# Patient Record
Sex: Male | Born: 1955
Health system: Southern US, Community
[De-identification: ages and names within clinical notes are randomized; demographics above are authoritative.]

## PROBLEM LIST (undated history)

## (undated) DIAGNOSIS — N200 Calculus of kidney: Secondary | ICD-10-CM

## (undated) DIAGNOSIS — E7800 Pure hypercholesterolemia, unspecified: Secondary | ICD-10-CM

## (undated) DIAGNOSIS — I2510 Atherosclerotic heart disease of native coronary artery without angina pectoris: Secondary | ICD-10-CM

## (undated) DIAGNOSIS — I219 Acute myocardial infarction, unspecified: Secondary | ICD-10-CM

## (undated) DIAGNOSIS — E119 Type 2 diabetes mellitus without complications: Secondary | ICD-10-CM

## (undated) DIAGNOSIS — E079 Disorder of thyroid, unspecified: Secondary | ICD-10-CM

## (undated) DIAGNOSIS — G8929 Other chronic pain: Secondary | ICD-10-CM

## (undated) DIAGNOSIS — I10 Essential (primary) hypertension: Secondary | ICD-10-CM

## (undated) HISTORY — PX: CORONARY ANGIOPLASTY WITH STENT PLACEMENT: SHX49

## (undated) HISTORY — PX: CHOLECYSTECTOMY: SHX55

## (undated) HISTORY — PX: BACK SURGERY: SHX140

---

## 2017-06-20 ENCOUNTER — Emergency Department (HOSPITAL_BASED_OUTPATIENT_CLINIC_OR_DEPARTMENT_OTHER): Payer: BLUE CROSS/BLUE SHIELD

## 2017-06-20 ENCOUNTER — Encounter (HOSPITAL_BASED_OUTPATIENT_CLINIC_OR_DEPARTMENT_OTHER): Payer: Self-pay

## 2017-06-20 ENCOUNTER — Emergency Department (HOSPITAL_BASED_OUTPATIENT_CLINIC_OR_DEPARTMENT_OTHER)
Admission: EM | Admit: 2017-06-20 | Discharge: 2017-06-20 | Disposition: A | Discharge status: Home / Self Care | Payer: BLUE CROSS/BLUE SHIELD | Attending: Emergency Medicine | Admitting: Emergency Medicine

## 2017-06-20 DIAGNOSIS — I119 Hypertensive heart disease without heart failure: Secondary | ICD-10-CM | POA: Diagnosis not present

## 2017-06-20 DIAGNOSIS — N200 Calculus of kidney: Secondary | ICD-10-CM | POA: Diagnosis not present

## 2017-06-20 DIAGNOSIS — R1084 Generalized abdominal pain: Secondary | ICD-10-CM | POA: Diagnosis present

## 2017-06-20 DIAGNOSIS — I2510 Atherosclerotic heart disease of native coronary artery without angina pectoris: Secondary | ICD-10-CM | POA: Diagnosis not present

## 2017-06-20 DIAGNOSIS — I252 Old myocardial infarction: Secondary | ICD-10-CM | POA: Insufficient documentation

## 2017-06-20 HISTORY — DX: Atherosclerotic heart disease of native coronary artery without angina pectoris: I25.10

## 2017-06-20 HISTORY — DX: Acute myocardial infarction, unspecified: I21.9

## 2017-06-20 HISTORY — DX: Calculus of kidney: N20.0

## 2017-06-20 HISTORY — DX: Disorder of thyroid, unspecified: E07.9

## 2017-06-20 HISTORY — DX: Essential (primary) hypertension: I10

## 2017-06-20 HISTORY — DX: Pure hypercholesterolemia, unspecified: E78.00

## 2017-06-20 HISTORY — DX: Type 2 diabetes mellitus without complications: E11.9

## 2017-06-20 LAB — URINALYSIS, MICROSCOPIC (REFLEX)
Squamous Epithelial / LPF: NONE SEEN
WBC, UA: NONE SEEN WBC/hpf (ref 0–5)

## 2017-06-20 LAB — URINALYSIS, ROUTINE W REFLEX MICROSCOPIC
BILIRUBIN URINE: NEGATIVE
GLUCOSE, UA: 100 mg/dL — AB
KETONES UR: 40 mg/dL — AB
Leukocytes, UA: NEGATIVE
NITRITE: NEGATIVE
PH: 5 (ref 5.0–8.0)
Protein, ur: NEGATIVE mg/dL
Specific Gravity, Urine: 1.023 (ref 1.005–1.030)

## 2017-06-20 MED ORDER — HYDROCODONE-ACETAMINOPHEN 5-325 MG PO TABS: 1.0000 | ORAL_TABLET | ORAL | 0 refills | Status: DC | PRN

## 2017-06-20 MED ORDER — HYDROMORPHONE HCL 1 MG/ML IJ SOLN
1.0000 mg | Freq: Once | INTRAMUSCULAR | Status: AC
  Administered 2017-06-20: 1 mg via INTRAVENOUS
  Filled 2017-06-20: qty 1

## 2017-06-20 MED ORDER — ONDANSETRON HCL 4 MG/2ML IJ SOLN
4.0000 mg | Freq: Once | INTRAMUSCULAR | Status: AC
  Administered 2017-06-20: 4 mg via INTRAVENOUS
  Filled 2017-06-20: qty 2

## 2017-06-20 MED ORDER — HYDROMORPHONE HCL 1 MG/ML IJ SOLN
1.0000 mg | Freq: Once | INTRAMUSCULAR | Status: AC | PRN
  Administered 2017-06-20: 1 mg via INTRAVENOUS
  Filled 2017-06-20: qty 1

## 2017-06-20 MED ORDER — SODIUM CHLORIDE 0.9 % IV SOLN
Freq: Once | INTRAVENOUS | Status: AC
  Administered 2017-06-20: 07:00:00 via INTRAVENOUS

## 2017-06-20 MED ORDER — KETOROLAC TROMETHAMINE 15 MG/ML IJ SOLN
15.0000 mg | Freq: Once | INTRAMUSCULAR | Status: AC
  Administered 2017-06-20: 15 mg via INTRAVENOUS
  Filled 2017-06-20: qty 1

## 2017-06-20 MED ORDER — ONDANSETRON HCL 4 MG PO TABS: 4.0000 mg | ORAL_TABLET | Freq: Three times a day (TID) | ORAL | 0 refills | Status: DC | PRN

## 2017-06-20 MED ORDER — TAMSULOSIN HCL 0.4 MG PO CAPS: 0.4000 mg | ORAL_CAPSULE | Freq: Every day | ORAL | 0 refills | Status: DC

## 2017-06-20 MED FILL — TAMSULOSIN HCL 0.4 MG CAP: 0.4 | 14 days supply | Qty: 14 | Fill #0

## 2017-06-20 MED FILL — HYDROCODON-APAP 5-325: 5-325 | 2 days supply | Qty: 10 | Fill #0

## 2017-06-20 MED FILL — ONDANSETRON HCL 4 MG TABLET: 4 | 4 days supply | Qty: 12 | Fill #0

## 2017-06-20 NOTE — ED Provider Notes (Signed)
Care assumed from Dr. Read DriversMolpus.   At time of transfer of care, patient is awaiting results of urinalysis and CT scan for suspected kidney stone.  Patient reported feeling better after pain medications. Urinalysis did not show evidence of UTI or infected stone. CT study shows evidence of 2 mm stone at the left UVJ.  Patient was reassessed and had improvement in pain. Patient given prescription for Flomax, pain medicine, nausea medicine and discharged home with instructions to follow-up with a PCP and possibly urology. Patient understood return precautions for any new or worsened symptoms. Patient had no other questions or concerns and was discharged in good condition.    Clinical Impression: 1. Kidney stone on left side     Disposition: Discharge  Condition: Good  I have discussed the results, Dx and Tx plan with the pt(& family if present). He/she/they expressed understanding and agree(s) with the plan. Discharge instructions discussed at great length. Strict return precautions discussed and pt &/or family have verbalized understanding of the instructions. No further questions at time of discharge.    New Prescriptions   HYDROCODONE-ACETAMINOPHEN (NORCO/VICODIN) 5-325 MG TABLET    Take 1 tablet by mouth every 4 (four) hours as needed.   ONDANSETRON (ZOFRAN) 4 MG TABLET    Take 1 tablet (4 mg total) by mouth every 8 (eight) hours as needed for nausea or vomiting.   TAMSULOSIN (FLOMAX) 0.4 MG CAPS CAPSULE    Take 1 capsule (0.4 mg total) by mouth daily.    [facility] [address] [place] [id number] [phone] Schedule an appointment as soon as possible for a visit    [facility] [address] [id number] [place]c [address] [phone]  If symptoms worsen     Morgyn Marut, Canary Brimhristopher J, MD 06/20/17 1123

## 2017-06-20 NOTE — Discharge Instructions (Signed)
Please use the pain medicine and nausea medicine and Flomax to help with your discomfort and to help pass your stone. Please watch for signs and symptoms of infection. Please follow-up with a primary care physician for further management. If any symptoms change or worsen, please return to the nearest emergency department.

## 2017-06-20 NOTE — ED Notes (Signed)
Patient transported to CT 

## 2017-06-20 NOTE — ED Triage Notes (Signed)
Pt c/o sudden onset left flank pain that radiates to lower abdomen with n/v

## 2017-06-20 NOTE — ED Provider Notes (Signed)
[facility] Provider Note: Lowella DellJ. Lane Makell Drohan, MD, FACEP  CSN: [id number] MRN: [id number] ARRIVAL: 06/20/17 at 0622 ROOM: MH05/[facility]05   CHIEF COMPLAINT  Flank Pain   HISTORY OF PRESENT ILLNESS  Adrian Carlson is a 61 y.o. male with a history of kidney stones. He is here with left flank pain that began about 1:30 this morning. He took an oxycodone tablet with some improvement was able to go back to sleep. He was reawakened about 4:30 AM with severe pain. He has vomited multiple times and has urinated several times. He also feels that urge to move his bowels but has only passed a small amount of stool. The pain in his flank radiates to his left lower quadrant. Pain is similar to previous kidney stones. There is no significant change with movement or palpation.    Past Medical History:  Diagnosis Date  . Coronary artery disease   . Diabetes mellitus without complication (HCC)   . High cholesterol   . Hypertension   . Kidney stone   . MI (myocardial infarction) (HCC)    twice, 2015  . Thyroid disease     Past Surgical History:  Procedure Laterality Date  . BACK SURGERY    . CHOLECYSTECTOMY    . CORONARY ANGIOPLASTY WITH STENT PLACEMENT      No family history on file.  Social History  Substance Use Topics  . Smoking status: Never Smoker  . Smokeless tobacco: Never Used  . Alcohol use No    Prior to Admission medications   Medication Sig Start Date End Date Taking? Authorizing Provider  HYDROcodone-acetaminophen (NORCO/VICODIN) 5-325 MG tablet Take 1 tablet by mouth every 4 (four) hours as needed. 06/20/17   Tegeler, Canary Brimhristopher J, MD  ondansetron (ZOFRAN) 4 MG tablet Take 1 tablet (4 mg total) by mouth every 8 (eight) hours as needed for nausea or vomiting. 06/20/17   Tegeler, Canary Brimhristopher J, MD  tamsulosin (FLOMAX) 0.4 MG CAPS capsule Take 1 capsule (0.4 mg total) by mouth daily. 06/20/17   Tegeler, Canary Brimhristopher J, MD    Allergies Patient has no known  allergies.   REVIEW OF SYSTEMS  Negative except as noted here or in the History of Present Illness.   PHYSICAL EXAMINATION  Initial Vital Signs Blood pressure (!) 157/93, pulse 66, temperature 98.2 F (36.8 C), temperature source Oral, resp. rate 18, height 6\' 1"  (1.854 m), weight 113.4 kg (250 lb), SpO2 100 %.  Examination General: Well-developed, well-nourished male in no acute distress; appearance consistent with age of record HENT: normocephalic; atraumatic Eyes: pupils equal, round and reactive to light; extraocular muscles intact Neck: supple Heart: regular rate and rhythm Lungs: clear to auscultation bilaterally Abdomen: soft; nondistended; minimal left lower quadrant tenderness; no masses or hepatosplenomegaly; bowel sounds present GU: No CVA tenderness Extremities: No deformity; full range of motion; pulses normal Neurologic: Awake, alert and oriented; motor function intact in all extremities and symmetric; no facial droop Skin: Warm and dry Psychiatric: Grimacing   RESULTS  Summary of this visit's results, reviewed by myself:   EKG Interpretation  Date/Time:    Ventricular Rate:    PR Interval:    QRS Duration:   QT Interval:    QTC Calculation:   R Axis:     Text Interpretation:        Laboratory Studies: Results for orders placed or performed during the hospital encounter of 06/20/17 (from the past 24 hour(s))  Urinalysis, Routine w reflex microscopic     Status: Abnormal  Collection Time: 06/20/17  6:53 AM  Result Value Ref Range   Color, Urine YELLOW YELLOW   APPearance CLOUDY (A) CLEAR   Specific Gravity, Urine 1.023 1.005 - 1.030   pH 5.0 5.0 - 8.0   Glucose, UA 100 (A) NEGATIVE mg/dL   Hgb urine dipstick LARGE (A) NEGATIVE   Bilirubin Urine NEGATIVE NEGATIVE   Ketones, ur 40 (A) NEGATIVE mg/dL   Protein, ur NEGATIVE NEGATIVE mg/dL   Nitrite NEGATIVE NEGATIVE   Leukocytes, UA NEGATIVE NEGATIVE  Urinalysis, Microscopic (reflex)     Status:  Abnormal   Collection Time: 06/20/17  6:53 AM  Result Value Ref Range   RBC / HPF TOO NUMEROUS TO COUNT 0 - 5 RBC/hpf   WBC, UA NONE SEEN 0 - 5 WBC/hpf   Bacteria, UA FEW (A) NONE SEEN   Squamous Epithelial / LPF NONE SEEN NONE SEEN   Uric Acid Crys, UA PRESENT    Imaging Studies: Ct Renal Stone Study  Result Date: 06/20/2017 CLINICAL DATA:  Acute onset left flank pain this morning. Nausea. Nephrolithiasis. EXAM: CT ABDOMEN AND PELVIS WITHOUT CONTRAST TECHNIQUE: Multidetector CT imaging of the abdomen and pelvis was performed following the standard protocol without IV contrast. COMPARISON:  None. FINDINGS: Lower chest: No acute findings. Hepatobiliary: No masses visualized on this unenhanced exam. Mild hepatic steatosis. Prior cholecystectomy noted. No evidence of biliary dilatation. Pancreas: No mass or inflammatory process visualized on this unenhanced exam. Spleen:  Within normal limits in size. Adrenals/Urinary tract: Several tiny less than 5 mm intrarenal calculi are seen bilaterally. Mild left hydronephrosis is seen due to a 2 mm calculus in the distal left ureter near the ureterovesical junction. Unremarkable urinary bladder. Stomach/Bowel: No evidence of obstruction, inflammatory process, or abnormal fluid collections. Sigmoid diverticulosis noted, without evidence of diverticulitis. Normal appendix visualized. Vascular/Lymphatic: No pathologically enlarged lymph nodes identified. No evidence of abdominal aortic aneurysm. Aortic atherosclerosis. Reproductive:  No mass or other significant abnormality. Other:  Small left inguinal hernia containing only fat. Musculoskeletal:  No suspicious bone lesions identified. IMPRESSION: Mild left hydroureteronephrosis due to 2 mm distal ureteral calculus near the left ureterovesical junction. Colonic diverticulosis. No radiographic evidence of diverticulitis. Mild hepatic steatosis. Aortic atherosclerosis. Electronically Signed   By: Myles Rosenthal M.D.   On:  06/20/2017 07:55    ED COURSE  Nursing notes and initial vitals signs, including pulse oximetry, reviewed.  Vitals:   06/20/17 0633 06/20/17 0736 06/20/17 0843  BP: (!) 157/93 (!) 154/84 (!) 151/95  Pulse: 66 (!) 56 64  Resp: 18 20 18   Temp: 98.2 F (36.8 C)    TempSrc: Oral    SpO2: 100% 94% 99%  Weight: 113.4 kg (250 lb)    Height: 6\' 1"  (1.854 m)     6:58 AM Pain improved with IV Dilaudid. Urinalysis and CT scan pending. Dr. Rush Landmark to follow up on results and make disposition.  PROCEDURES    ED DIAGNOSES     ICD-10-CM   1. Kidney stone on left side N20.0        Shataya Winkles, MD 06/20/17 1046

## 2018-05-01 ENCOUNTER — Encounter (HOSPITAL_BASED_OUTPATIENT_CLINIC_OR_DEPARTMENT_OTHER): Payer: Self-pay | Admitting: *Deleted

## 2018-05-01 ENCOUNTER — Other Ambulatory Visit: Payer: Self-pay

## 2018-05-01 ENCOUNTER — Emergency Department (HOSPITAL_BASED_OUTPATIENT_CLINIC_OR_DEPARTMENT_OTHER)
Admission: EM | Admit: 2018-05-01 | Discharge: 2018-05-01 | Disposition: A | Discharge status: Home / Self Care | Payer: BLUE CROSS/BLUE SHIELD | Attending: Emergency Medicine | Admitting: Emergency Medicine

## 2018-05-01 DIAGNOSIS — I252 Old myocardial infarction: Secondary | ICD-10-CM | POA: Insufficient documentation

## 2018-05-01 DIAGNOSIS — E119 Type 2 diabetes mellitus without complications: Secondary | ICD-10-CM | POA: Insufficient documentation

## 2018-05-01 DIAGNOSIS — I10 Essential (primary) hypertension: Secondary | ICD-10-CM | POA: Insufficient documentation

## 2018-05-01 DIAGNOSIS — I2510 Atherosclerotic heart disease of native coronary artery without angina pectoris: Secondary | ICD-10-CM | POA: Insufficient documentation

## 2018-05-01 DIAGNOSIS — E079 Disorder of thyroid, unspecified: Secondary | ICD-10-CM | POA: Insufficient documentation

## 2018-05-01 DIAGNOSIS — N23 Unspecified renal colic: Secondary | ICD-10-CM | POA: Diagnosis not present

## 2018-05-01 DIAGNOSIS — R109 Unspecified abdominal pain: Secondary | ICD-10-CM | POA: Diagnosis present

## 2018-05-01 LAB — COMPREHENSIVE METABOLIC PANEL
ALT: 21 U/L (ref 17–63)
ANION GAP: 9 (ref 5–15)
AST: 21 U/L (ref 15–41)
Albumin: 4.5 g/dL (ref 3.5–5.0)
Alkaline Phosphatase: 59 U/L (ref 38–126)
BUN: 22 mg/dL — ABNORMAL HIGH (ref 6–20)
CHLORIDE: 106 mmol/L (ref 101–111)
CO2: 26 mmol/L (ref 22–32)
Calcium: 9.4 mg/dL (ref 8.9–10.3)
Creatinine, Ser: 1.37 mg/dL — ABNORMAL HIGH (ref 0.61–1.24)
GFR calc Af Amer: >60 mL/min (ref >60)
GFR, EST NON AFRICAN AMERICAN: 55 mL/min — AB (ref >60)
Glucose, Bld: 243 mg/dL — ABNORMAL HIGH (ref 65–99)
Potassium: 4.3 mmol/L (ref 3.5–5.1)
SODIUM: 141 mmol/L (ref 135–145)
Total Bilirubin: 0.7 mg/dL (ref 0.3–1.2)
Total Protein: 7.6 g/dL (ref 6.5–8.1)

## 2018-05-01 LAB — URINALYSIS, ROUTINE W REFLEX MICROSCOPIC
Bilirubin Urine: NEGATIVE
Glucose, UA: 250 mg/dL — AB
KETONES UR: NEGATIVE mg/dL
LEUKOCYTES UA: NEGATIVE
NITRITE: NEGATIVE
PH: 5 (ref 5.0–8.0)
Protein, ur: NEGATIVE mg/dL

## 2018-05-01 LAB — CBC WITH DIFFERENTIAL/PLATELET
BASOS ABS: 0 10*3/uL (ref 0.0–0.1)
Basophils Relative: 0 %
Eosinophils Absolute: 0.1 10*3/uL (ref 0.0–0.7)
Eosinophils Relative: 1 %
HCT: 46.4 % (ref 39.0–52.0)
Hemoglobin: 16.2 g/dL (ref 13.0–17.0)
LYMPHS PCT: 12 %
Lymphs Abs: 1 10*3/uL (ref 0.7–4.0)
MCH: 32.6 pg (ref 26.0–34.0)
MCHC: 34.9 g/dL (ref 30.0–36.0)
MCV: 93.4 fL (ref 78.0–100.0)
MONO ABS: 0.5 10*3/uL (ref 0.1–1.0)
Monocytes Relative: 6 %
Neutro Abs: 6.7 10*3/uL (ref 1.7–7.7)
Neutrophils Relative %: 81 %
PLATELETS: 183 10*3/uL (ref 150–400)
RBC: 4.97 MIL/uL (ref 4.22–5.81)
RDW: 12.9 % (ref 11.5–15.5)
WBC: 8.3 10*3/uL (ref 4.0–10.5)

## 2018-05-01 LAB — URINALYSIS, MICROSCOPIC (REFLEX)

## 2018-05-01 MED ORDER — OXYCODONE-ACETAMINOPHEN 5-325 MG PO TABS: 1.0000 | ORAL_TABLET | ORAL | 0 refills | Status: DC | PRN

## 2018-05-01 MED ORDER — ONDANSETRON HCL 4 MG/2ML IJ SOLN
INTRAMUSCULAR | Status: AC
  Filled 2018-05-01: qty 2

## 2018-05-01 MED ORDER — HYDROMORPHONE HCL 1 MG/ML IJ SOLN
1.0000 mg | Freq: Once | INTRAMUSCULAR | Status: AC
  Administered 2018-05-01: 1 mg via INTRAVENOUS
  Filled 2018-05-01: qty 1

## 2018-05-01 MED ORDER — ONDANSETRON HCL 4 MG/2ML IJ SOLN
4.0000 mg | Freq: Once | INTRAMUSCULAR | Status: AC
  Administered 2018-05-01: 4 mg via INTRAVENOUS

## 2018-05-01 MED ORDER — TAMSULOSIN HCL 0.4 MG PO CAPS: 0.4000 mg | ORAL_CAPSULE | Freq: Every day | ORAL | 0 refills | Status: AC

## 2018-05-01 MED ORDER — HYDROMORPHONE HCL 1 MG/ML IJ SOLN
0.5000 mg | Freq: Once | INTRAMUSCULAR | Status: AC
  Administered 2018-05-01: 0.5 mg via INTRAVENOUS
  Filled 2018-05-01: qty 1

## 2018-05-01 MED ORDER — ONDANSETRON HCL 4 MG PO TABS: 4.0000 mg | ORAL_TABLET | Freq: Three times a day (TID) | ORAL | 0 refills | Status: DC | PRN

## 2018-05-01 MED ORDER — KETOROLAC TROMETHAMINE 30 MG/ML IJ SOLN
30.0000 mg | Freq: Once | INTRAMUSCULAR | Status: AC
  Administered 2018-05-01: 30 mg via INTRAVENOUS
  Filled 2018-05-01: qty 1

## 2018-05-01 MED ORDER — SODIUM CHLORIDE 0.9 % IV BOLUS
1000.0000 mL | Freq: Once | INTRAVENOUS | Status: AC
  Administered 2018-05-01: 1000 mL via INTRAVENOUS

## 2018-05-01 MED FILL — TAMSULOSIN HCL 0.4 MG CAP: 0.4 | 10 days supply | Qty: 10 | Fill #0

## 2018-05-01 MED FILL — ONDANSETRON HCL 4 MG TABLET: 4 | 4 days supply | Qty: 12 | Fill #0

## 2018-05-01 MED FILL — OXYCODONE-ACETAMINOPHEN 5-3: 5-325 | 2 days supply | Qty: 10 | Fill #0

## 2018-05-01 NOTE — ED Provider Notes (Signed)
[facility] Provider Note   CSN: [id number] Arrival date & time: 05/01/18  1914     History   Chief Complaint Chief Complaint  Patient presents with  . Flank Pain    HPI Adrian Carlson is a 62 y.o. male.  HPI Patient with a history of kidney stones presents with acute onset right flank pain radiating to the right abdomen starting at 5 AM this morning.  Associated with nausea and vomiting.  No hematuria, dysuria or urinary hesitancy.  States pain is the same as when he is past previous kidney stones. Past Medical History:  Diagnosis Date  . Coronary artery disease   . Diabetes mellitus without complication (HCC)   . High cholesterol   . Hypertension   . Kidney stone   . MI (myocardial infarction) (HCC)    twice, 2015  . Thyroid disease     There are no active problems to display for this patient.   Past Surgical History:  Procedure Laterality Date  . BACK SURGERY    . CHOLECYSTECTOMY    . CORONARY ANGIOPLASTY WITH STENT PLACEMENT          Home Medications    Prior to Admission medications   Medication Sig Start Date End Date Taking? Authorizing Provider  HYDROcodone-acetaminophen (NORCO/VICODIN) 5-325 MG tablet Take 1 tablet by mouth every 4 (four) hours as needed. 06/20/17   Tegeler, Canary Brim, MD  ondansetron (ZOFRAN) 4 MG tablet Take 1 tablet (4 mg total) by mouth every 8 (eight) hours as needed for nausea or vomiting. 05/01/18   Loren Racer, MD  oxyCODONE-acetaminophen (PERCOCET) 5-325 MG tablet Take 1 tablet by mouth every 4 (four) hours as needed for severe pain. 05/01/18   Loren Racer, MD  tamsulosin (FLOMAX) 0.4 MG CAPS capsule Take 1 capsule (0.4 mg total) by mouth daily. 05/01/18   Loren Racer, MD    Family History History reviewed. No pertinent family history.  Social History Social History   Tobacco Use  . Smoking status: Never Smoker  . Smokeless tobacco: Never Used  Substance Use Topics  . Alcohol  use: No  . Drug use: Not on file     Allergies   Patient has no known allergies.   Review of Systems Review of Systems  Constitutional: Negative for chills and fever.  Respiratory: Negative for shortness of breath.   Cardiovascular: Negative for chest pain.  Gastrointestinal: Positive for abdominal pain, nausea and vomiting. Negative for constipation and diarrhea.  Genitourinary: Positive for flank pain. Negative for dysuria, frequency, hematuria, scrotal swelling and testicular pain.  Musculoskeletal: Positive for back pain. Negative for neck pain.  Skin: Negative for rash and wound.  Neurological: Negative for weakness, numbness and headaches.  All other systems reviewed and are negative.    Physical Exam Updated Vital Signs BP 127/81   Pulse (!) 49   Temp 98 F (36.7 C) (Oral)   Resp (!) 28   Ht  (1.854 m)   Wt 120.2 kg (265 lb)   SpO2 97%   BMI 34.96 kg/m   Physical Exam  Constitutional: He is oriented to person, place, and time. He appears well-developed and well-nourished. He appears distressed.  Patient appears uncomfortable.  HENT:  Head: Normocephalic and atraumatic.  Mouth/Throat: Oropharynx is clear and moist.  Eyes: Pupils are equal, round, and reactive to light. EOM are normal.  Neck: Normal range of motion. Neck supple.  Cardiovascular: Normal rate and regular rhythm.  Pulmonary/Chest: Effort normal and  breath sounds normal. No respiratory distress. He has no wheezes. He has no rales.  Abdominal: Soft. Bowel sounds are normal. There is tenderness.  Very mild right lower quadrant abdominal tenderness with palpation without rebound or guarding.  Musculoskeletal: Normal range of motion. He exhibits no edema or tenderness.  Mild right CVA tenderness to percussion.  No midline thoracic or lumbar tenderness.  No lower extremity swelling, asymmetry or tenderness.  Distal pulses intact.  Neurological: He is alert and oriented to person, place, and time.    Moving all extremities without focal deficit.  Sensation intact.  Skin: Skin is warm. Capillary refill takes less than 2 seconds. No rash noted. He is diaphoretic. No erythema.  Psychiatric: He has a normal mood and affect. His behavior is normal.  Nursing note and vitals reviewed.    ED Treatments / Results  Labs (all labs ordered are listed, but only abnormal results are displayed) Labs Reviewed  COMPREHENSIVE METABOLIC PANEL - Abnormal; Notable for the following components:      Result Value   Glucose, Bld 243 (*)    BUN 22 (*)    Creatinine, Ser 1.37 (*)    GFR calc non Af Amer 55 (*)    All other components within normal limits  URINALYSIS, ROUTINE W REFLEX MICROSCOPIC - Abnormal; Notable for the following components:   Specific Gravity, Urine >1.030 (*)    Glucose, UA 250 (*)    Hgb urine dipstick MODERATE (*)    All other components within normal limits  URINALYSIS, MICROSCOPIC (REFLEX) - Abnormal; Notable for the following components:   Bacteria, UA RARE (*)    All other components within normal limits  CBC WITH DIFFERENTIAL/PLATELET    EKG None  Radiology No results found.  Procedures Procedures (including critical care time)  Medications Ordered in ED Medications  ondansetron (ZOFRAN) injection 4 mg (4 mg Intravenous Given 05/01/18 0744)  sodium chloride 0.9 % bolus 1,000 mL (0 mLs Intravenous Stopped 05/01/18 1259)  ketorolac (TORADOL) 30 MG/ML injection 30 mg (30 mg Intravenous Given 05/01/18 0753)  HYDROmorphone (DILAUDID) injection 1 mg (1 mg Intravenous Given 05/01/18 0753)  HYDROmorphone (DILAUDID) injection 0.5 mg (0.5 mg Intravenous Given 05/01/18 1134)     Initial Impression / Assessment and Plan / ED Course  I have reviewed the triage vital signs and the nursing notes.  Pertinent labs & imaging results that were available during my care of the patient were reviewed by me and considered in my medical decision making (see chart for details).      Patient is feeling better after medication.  Advised to follow-up with urology.  Return precautions given.  Final Clinical Impressions(s) / ED Diagnoses   Final diagnoses:  Ureteral colic    ED Discharge Orders        Ordered    oxyCODONE-acetaminophen (PERCOCET) 5-325 MG tablet  Every 4 hours PRN     05/01/18 1249    ondansetron (ZOFRAN) 4 MG tablet  Every 8 hours PRN     05/01/18 1249    tamsulosin (FLOMAX) 0.4 MG CAPS capsule  Daily     05/01/18 1249       Loren Racer, MD 05/02/18 1641

## 2018-05-01 NOTE — ED Notes (Signed)
NAD at this time. Pt is stable and going home.  

## 2018-05-01 NOTE — ED Triage Notes (Signed)
Pt reports sudden onset of right flank pain at 5am, his usual kidney stone pain. Denies fevers or hematuria.

## 2018-05-02 ENCOUNTER — Emergency Department (HOSPITAL_BASED_OUTPATIENT_CLINIC_OR_DEPARTMENT_OTHER)
Admission: EM | Admit: 2018-05-02 | Discharge: 2018-05-03 | Disposition: A | Discharge status: Home / Self Care | Payer: BLUE CROSS/BLUE SHIELD | Attending: Emergency Medicine | Admitting: Emergency Medicine

## 2018-05-02 ENCOUNTER — Other Ambulatory Visit: Payer: Self-pay

## 2018-05-02 ENCOUNTER — Emergency Department (HOSPITAL_BASED_OUTPATIENT_CLINIC_OR_DEPARTMENT_OTHER): Payer: BLUE CROSS/BLUE SHIELD

## 2018-05-02 ENCOUNTER — Encounter (HOSPITAL_BASED_OUTPATIENT_CLINIC_OR_DEPARTMENT_OTHER): Payer: Self-pay | Admitting: Adult Health

## 2018-05-02 DIAGNOSIS — N201 Calculus of ureter: Secondary | ICD-10-CM | POA: Diagnosis not present

## 2018-05-02 DIAGNOSIS — I10 Essential (primary) hypertension: Secondary | ICD-10-CM | POA: Diagnosis not present

## 2018-05-02 DIAGNOSIS — I2510 Atherosclerotic heart disease of native coronary artery without angina pectoris: Secondary | ICD-10-CM | POA: Diagnosis not present

## 2018-05-02 DIAGNOSIS — E119 Type 2 diabetes mellitus without complications: Secondary | ICD-10-CM | POA: Insufficient documentation

## 2018-05-02 DIAGNOSIS — R109 Unspecified abdominal pain: Secondary | ICD-10-CM | POA: Diagnosis present

## 2018-05-02 DIAGNOSIS — Z79899 Other long term (current) drug therapy: Secondary | ICD-10-CM | POA: Insufficient documentation

## 2018-05-02 DIAGNOSIS — I252 Old myocardial infarction: Secondary | ICD-10-CM | POA: Diagnosis not present

## 2018-05-02 LAB — COMPREHENSIVE METABOLIC PANEL
ALBUMIN: 4 g/dL (ref 3.5–5.0)
ALT: 27 U/L (ref 17–63)
ANION GAP: 8 (ref 5–15)
AST: 18 U/L (ref 15–41)
Alkaline Phosphatase: 43 U/L (ref 38–126)
BILIRUBIN TOTAL: 1 mg/dL (ref 0.3–1.2)
BUN: 28 mg/dL — ABNORMAL HIGH (ref 6–20)
CALCIUM: 8.4 mg/dL — AB (ref 8.9–10.3)
CO2: 25 mmol/L (ref 22–32)
Chloride: 104 mmol/L (ref 101–111)
Creatinine, Ser: 1.83 mg/dL — ABNORMAL HIGH (ref 0.61–1.24)
GFR, EST AFRICAN AMERICAN: 45 mL/min — AB (ref >60)
GFR, EST NON AFRICAN AMERICAN: 39 mL/min — AB (ref >60)
Glucose, Bld: 154 mg/dL — ABNORMAL HIGH (ref 65–99)
POTASSIUM: 4 mmol/L (ref 3.5–5.1)
Sodium: 137 mmol/L (ref 135–145)
Total Protein: 6.8 g/dL (ref 6.5–8.1)

## 2018-05-02 LAB — URINALYSIS, MICROSCOPIC (REFLEX)

## 2018-05-02 LAB — CBC WITH DIFFERENTIAL/PLATELET
BASOS PCT: 0 %
Basophils Absolute: 0 10*3/uL (ref 0.0–0.1)
EOS ABS: 0.1 10*3/uL (ref 0.0–0.7)
EOS PCT: 1 %
HEMATOCRIT: 42.4 % (ref 39.0–52.0)
Hemoglobin: 14.5 g/dL (ref 13.0–17.0)
LYMPHS PCT: 9 %
Lymphs Abs: 0.8 10*3/uL (ref 0.7–4.0)
MCH: 32.4 pg (ref 26.0–34.0)
MCHC: 34.2 g/dL (ref 30.0–36.0)
MCV: 94.9 fL (ref 78.0–100.0)
Monocytes Absolute: 0.9 10*3/uL (ref 0.1–1.0)
Monocytes Relative: 10 %
NEUTROS ABS: 7.4 10*3/uL (ref 1.7–7.7)
Neutrophils Relative %: 80 %
Platelets: 119 10*3/uL — ABNORMAL LOW (ref 150–400)
RBC: 4.47 MIL/uL (ref 4.22–5.81)
RDW: 12.9 % (ref 11.5–15.5)
WBC: 9.2 10*3/uL (ref 4.0–10.5)

## 2018-05-02 LAB — URINALYSIS, ROUTINE W REFLEX MICROSCOPIC
BILIRUBIN URINE: NEGATIVE
Glucose, UA: NEGATIVE mg/dL
Ketones, ur: NEGATIVE mg/dL
LEUKOCYTES UA: NEGATIVE
NITRITE: NEGATIVE
PROTEIN: NEGATIVE mg/dL
Specific Gravity, Urine: 1.025 (ref 1.005–1.030)
pH: 5.5 (ref 5.0–8.0)

## 2018-05-02 LAB — LIPASE, BLOOD: LIPASE: 20 U/L (ref 11–51)

## 2018-05-02 MED ORDER — SODIUM CHLORIDE 0.9 % IV BOLUS
1000.0000 mL | Freq: Once | INTRAVENOUS | Status: AC
  Administered 2018-05-02: 1000 mL via INTRAVENOUS

## 2018-05-02 MED ORDER — HYDROMORPHONE HCL 1 MG/ML IJ SOLN
1.0000 mg | Freq: Once | INTRAMUSCULAR | Status: AC
  Administered 2018-05-02: 1 mg via INTRAVENOUS
  Filled 2018-05-02: qty 1

## 2018-05-02 MED ORDER — OXYCODONE-ACETAMINOPHEN 5-325 MG PO TABS
2.0000 | ORAL_TABLET | Freq: Once | ORAL | Status: AC
  Administered 2018-05-02: 2 via ORAL
  Filled 2018-05-02: qty 2

## 2018-05-02 MED ORDER — ONDANSETRON HCL 4 MG/2ML IJ SOLN
4.0000 mg | Freq: Once | INTRAMUSCULAR | Status: AC
Start: 2018-05-02 — End: 2018-05-02
  Administered 2018-05-02: 4 mg via INTRAVENOUS
  Filled 2018-05-02: qty 2

## 2018-05-02 NOTE — ED Provider Notes (Signed)
[facility] Provider Note   CSN: [id number] Arrival date & time: 05/02/18  1732     History   Chief Complaint No chief complaint on file.   HPI Adrian Carlson is a 62 y.o. male.  HPI Patient was seen in the emergency department yesterday for acute onset right flank pain radiating to the right groin.  Has history of multiple previous kidney stones and states that his pain felt like he was passing a stone.  Previous CT from a year ago showed bilateral renal stones.  Imaging was deferred and patient was treated symptomatically.  States he was feeling much better at the time of discharge.  Pain returned today around 12:00.  Unable to control his pain with medication.  Continues to describe the pain as right flank pain radiating down to the groin area.  Has had nausea but no vomiting. Past Medical History:  Diagnosis Date  . Coronary artery disease   . Diabetes mellitus without complication (HCC)   . High cholesterol   . Hypertension   . Kidney stone   . MI (myocardial infarction) (HCC)    twice, 2015  . Thyroid disease     There are no active problems to display for this patient.   Past Surgical History:  Procedure Laterality Date  . BACK SURGERY    . CHOLECYSTECTOMY    . CORONARY ANGIOPLASTY WITH STENT PLACEMENT          Home Medications    Prior to Admission medications   Medication Sig Start Date End Date Taking? Authorizing Provider  HYDROcodone-acetaminophen (NORCO/VICODIN) 5-325 MG tablet Take 1 tablet by mouth every 4 (four) hours as needed. 06/20/17   Tegeler, Canary Brim, MD  ondansetron (ZOFRAN) 4 MG tablet Take 1 tablet (4 mg total) by mouth every 8 (eight) hours as needed for nausea or vomiting. 05/01/18   Loren Racer, MD  oxyCODONE-acetaminophen (PERCOCET) 5-325 MG tablet Take 1 tablet by mouth every 4 (four) hours as needed for severe pain. 05/01/18   Loren Racer, MD  tamsulosin (FLOMAX) 0.4 MG CAPS capsule Take 1  capsule (0.4 mg total) by mouth daily. 05/01/18   Loren Racer, MD    Family History History reviewed. No pertinent family history.  Social History Social History   Tobacco Use  . Smoking status: Never Smoker  . Smokeless tobacco: Never Used  Substance Use Topics  . Alcohol use: No  . Drug use: Not on file     Allergies   Patient has no known allergies.   Review of Systems Review of Systems  Constitutional: Negative for chills and fever.  Respiratory: Negative for shortness of breath.   Cardiovascular: Negative for chest pain and leg swelling.  Gastrointestinal: Positive for abdominal pain and nausea. Negative for constipation, diarrhea and vomiting.  Genitourinary: Positive for difficulty urinating and flank pain. Negative for dysuria, frequency and hematuria.  Musculoskeletal: Positive for back pain. Negative for neck pain and neck stiffness.  Skin: Negative for rash and wound.  Neurological: Negative for dizziness, weakness, light-headedness, numbness and headaches.  Psychiatric/Behavioral: The patient is nervous/anxious.   All other systems reviewed and are negative.    Physical Exam Updated Vital Signs BP (!) 148/84   Pulse 74   Temp 99.8 F (37.7 C) (Oral)   Resp (!) 22   Ht  (1.854 m)   Wt 116.1 kg (256 lb)   SpO2 99%   BMI 33.78 kg/m   Physical Exam  Constitutional: He is oriented  to person, place, and time. He appears well-developed and well-nourished. He appears distressed.  Uncomfortable appearing  HENT:  Head: Normocephalic and atraumatic.  Mouth/Throat: Oropharynx is clear and moist.  Eyes: Pupils are equal, round, and reactive to light. EOM are normal.  Neck: Normal range of motion. Neck supple.  Cardiovascular: Normal rate and regular rhythm. Exam reveals no gallop and no friction rub.  No murmur heard. Pulmonary/Chest: Effort normal and breath sounds normal.  Abdominal: Soft. Bowel sounds are normal. There is tenderness. There is no  rebound and no guarding.  Patient has mild right lower quadrant tenderness to palpation without guarding or rebound.  Musculoskeletal: Normal range of motion. He exhibits no edema or tenderness.  Neurological: He is alert and oriented to person, place, and time.  Moves all extremities without focal deficit.  Sensation intact.  Skin: Skin is warm and dry. Capillary refill takes less than 2 seconds. No rash noted. No erythema.  Psychiatric: His behavior is normal.  Patient appears to be in distress.  Anxious appearing.  Nursing note and vitals reviewed.    ED Treatments / Results  Labs (all labs ordered are listed, but only abnormal results are displayed) Labs Reviewed  CBC WITH DIFFERENTIAL/PLATELET  COMPREHENSIVE METABOLIC PANEL  LIPASE, BLOOD  URINALYSIS, ROUTINE W REFLEX MICROSCOPIC    EKG None  Radiology No results found.  Procedures Procedures (including critical care time)  Medications Ordered in ED Medications  HYDROmorphone (DILAUDID) injection 1 mg (has no administration in time range)  ondansetron (ZOFRAN) injection 4 mg (has no administration in time range)  sodium chloride 0.9 % bolus 1,000 mL (has no administration in time range)     Initial Impression / Assessment and Plan / ED Course  I have reviewed the triage vital signs and the nursing notes.  Pertinent labs & imaging results that were available during my care of the patient were reviewed by me and considered in my medical decision making (see chart for details).     Patient requiring multiple doses of medication to control symptoms.  Has elevation in his creatinine over baseline.  CT renal study with 5 mm ureteral stone on the right.  Discussed with Dr. Nolon Rod.  Recommends treating with oral pain medication and discharged to follow-up.  If symptoms are uncontrolled, admit to hospitalist at [facility] for urology consult in the morning.  Final Clinical Impressions(s) / ED Diagnoses   Final  diagnoses:  None    ED Discharge Orders    None       Loren Racer, MD 05/03/18 [id number]

## 2018-05-02 NOTE — ED Notes (Signed)
Pt unable to give urine sample at this time,.

## 2018-05-02 NOTE — ED Triage Notes (Signed)
PResents with severe abdominal pain and back pain that began yesterday, he was diagnosed with a kidney stone but had no imagining, today the pain is much worse and is in his back and abdomen. HE endorses nausea. "IT feels like my back is about to break in half"

## 2018-05-02 NOTE — ED Notes (Signed)
Patient transported to CT 

## 2018-05-02 NOTE — ED Notes (Addendum)
Pt c/o right flank pain with suspected kidney stone, pt has not had any pain medication since around noon.  Encouraged pt that should he get discharged again, he needs to stay ahead of the pain and medicate frequently in cases of kidney stones.  Pt taken off cardiac monitor as he is writhing in the bed and the leads are pulling loose, VSS, triage level two for pain.

## 2018-05-02 NOTE — ED Notes (Signed)
ED Provider at bedside. 

## 2018-05-03 MED ORDER — HYDROCODONE-ACETAMINOPHEN 5-325 MG PO TABS: 1.0000 | ORAL_TABLET | Freq: Four times a day (QID) | ORAL | 0 refills | Status: DC | PRN

## 2018-05-03 NOTE — ED Provider Notes (Signed)
Patient improved and in no distress.  We discussed CT findings.  He feels comfortable for discharge.  Refer to urology.  Will add on additional 5 Vicodin.  Advised to avoid ibuprofen. Narcotic database reviewed and considered in decision making   Zadie Rhine, MD 05/03/18 0111

## 2018-05-03 NOTE — ED Notes (Signed)
Pt verbalizes understanding of d/c instructions and denies any further needs at this time. 

## 2018-05-06 MED FILL — traMADol HCL 50 MG TABS: 50 | 3 days supply | Qty: 20 | Fill #0

## 2018-05-06 MED FILL — HYDROCODON-APAP 5-325: 5-325 | 2 days supply | Qty: 5 | Fill #0

## 2019-10-22 ENCOUNTER — Emergency Department (HOSPITAL_BASED_OUTPATIENT_CLINIC_OR_DEPARTMENT_OTHER)
Admission: EM | Admit: 2019-10-22 | Discharge: 2019-10-22 | Disposition: A | Discharge status: Home / Self Care | Payer: BC Managed Care – PPO | Attending: Emergency Medicine | Admitting: Emergency Medicine

## 2019-10-22 ENCOUNTER — Encounter (HOSPITAL_BASED_OUTPATIENT_CLINIC_OR_DEPARTMENT_OTHER): Payer: Self-pay | Admitting: Emergency Medicine

## 2019-10-22 ENCOUNTER — Other Ambulatory Visit: Payer: Self-pay

## 2019-10-22 DIAGNOSIS — E119 Type 2 diabetes mellitus without complications: Secondary | ICD-10-CM | POA: Insufficient documentation

## 2019-10-22 DIAGNOSIS — M5489 Other dorsalgia: Secondary | ICD-10-CM | POA: Diagnosis present

## 2019-10-22 DIAGNOSIS — M130 Polyarthritis, unspecified: Secondary | ICD-10-CM | POA: Insufficient documentation

## 2019-10-22 DIAGNOSIS — I259 Chronic ischemic heart disease, unspecified: Secondary | ICD-10-CM | POA: Insufficient documentation

## 2019-10-22 DIAGNOSIS — I10 Essential (primary) hypertension: Secondary | ICD-10-CM | POA: Insufficient documentation

## 2019-10-22 DIAGNOSIS — Z7982 Long term (current) use of aspirin: Secondary | ICD-10-CM | POA: Diagnosis not present

## 2019-10-22 DIAGNOSIS — Z7984 Long term (current) use of oral hypoglycemic drugs: Secondary | ICD-10-CM | POA: Insufficient documentation

## 2019-10-22 DIAGNOSIS — Z79899 Other long term (current) drug therapy: Secondary | ICD-10-CM | POA: Insufficient documentation

## 2019-10-22 DIAGNOSIS — M2550 Pain in unspecified joint: Secondary | ICD-10-CM

## 2019-10-22 HISTORY — DX: Other chronic pain: G89.29

## 2019-10-22 MED ORDER — ONDANSETRON 4 MG PO TBDP: 4.0000 mg | ORAL_TABLET | Freq: Three times a day (TID) | ORAL | 1 refills | Status: AC | PRN

## 2019-10-22 MED ORDER — HYDROCODONE-ACETAMINOPHEN 5-325 MG PO TABS: 1.0000 | ORAL_TABLET | Freq: Four times a day (QID) | ORAL | 0 refills | Status: DC | PRN

## 2019-10-22 MED ORDER — PREDNISONE 10 MG PO TABS: 40.0000 mg | ORAL_TABLET | Freq: Every day | ORAL | 0 refills | Status: AC

## 2019-10-22 MED ORDER — HYDROMORPHONE HCL 1 MG/ML IJ SOLN
2.0000 mg | Freq: Once | INTRAMUSCULAR | Status: AC
  Administered 2019-10-22: 08:00:00 2 mg via INTRAMUSCULAR
  Filled 2019-10-22: qty 2

## 2019-10-22 MED ORDER — PREDNISONE 50 MG PO TABS
60.0000 mg | ORAL_TABLET | Freq: Once | ORAL | Status: AC
  Administered 2019-10-22: 60 mg via ORAL
  Filled 2019-10-22: qty 1

## 2019-10-22 MED FILL — predniSONE 10 MG TABS: 10 | 5 days supply | Qty: 20 | Fill #0

## 2019-10-22 MED FILL — ONDANSETRON ODT 4 MG TABLET: 4 | 3 days supply | Qty: 10 | Fill #0

## 2019-10-22 MED FILL — HYDROCODON-APAP 5-325: 5-325 | 3 days supply | Qty: 14 | Fill #0

## 2019-10-22 NOTE — Discharge Instructions (Signed)
Recommend following back up with primary care doctor at least making sure that he is got baseline labs for your rheumatology appointment completed.  Going try the prednisone again for the next 5 days.  Take the hydrocodone as needed for pain relief.  Symptoms seem to be consistent with a polyarthralgia so rheumatology follow-up very important.

## 2019-10-22 NOTE — ED Provider Notes (Signed)
[facility] EMERGENCY DEPARTMENT Provider Note   CSN: [id number] Arrival date & time: 10/22/19  2409     History   Chief Complaint Chief Complaint  Patient presents with  . Back Pain    HPI Adrian Carlson is a 63 y.o. male.     Patient with about a 91-month history of multiple joint aches.  Seen by primary care provider.  Trial of prednisone in the past helped a little bit.  Pain is been increasing.  It is multiple joints usually 2 at a time.  No history of connective tissue disorder.  Or arthritis.  Primary care doctor has arranged rheumatology follow-up but that is not scheduled until November 30.  In the meantime patient has had worsening joint aches.  No fevers no redness.  Primary care doctor is already done basic labs according to patient.  Past medical history significant for coronary artery disease high cholesterol hypertension history of kidney stones.  Occasionally patient has some tingling sensations associated with the pain.  But the main complaint is pain.     Past Medical History:  Diagnosis Date  . Chronic back pain greater than 3 months duration   . Coronary artery disease   . Diabetes mellitus without complication (HCC)   . High cholesterol   . Hypertension   . Kidney stone   . MI (myocardial infarction) (HCC)    twice, 2015  . Thyroid disease     There are no active problems to display for this patient.   Past Surgical History:  Procedure Laterality Date  . BACK SURGERY    . CHOLECYSTECTOMY    . CORONARY ANGIOPLASTY WITH STENT PLACEMENT          Home Medications    Prior to Admission medications   Medication Sig Start Date End Date Taking? Authorizing Provider  aspirin 81 MG chewable tablet Chew by mouth. 08/22/15  Yes [provider]  atorvastatin (LIPITOR) 40 MG tablet Take 40 mg by mouth daily. 06/28/19   [provider]  HYDROcodone-acetaminophen (NORCO/VICODIN) 5-325 MG tablet Take 1 tablet by mouth every 6 (six)  hours as needed for moderate pain. 10/22/19   Vanetta Mulders, MD  levothyroxine (SYNTHROID) 150 MCG tablet Take 150 mcg by mouth daily. 10/08/19   [provider]  lisinopril (ZESTRIL) 5 MG tablet Take 5 mg by mouth daily. 10/16/19   [provider]  metFORMIN (GLUCOPHAGE-XR) 500 MG 24 hr tablet Take 500 mg by mouth 2 (two) times daily. 06/23/19   [provider]  nitroGLYCERIN (NITROSTAT) 0.4 MG SL tablet SMARTSIG:1 Tablet(s) Sublingual As Needed 10/16/19   [provider]  predniSONE (DELTASONE) 10 MG tablet Take 4 tablets (40 mg total) by mouth daily. 10/22/19   Vanetta Mulders, MD  sertraline (ZOLOFT) 50 MG tablet Take 50 mg by mouth daily. 08/18/19   [provider]  tamsulosin (FLOMAX) 0.4 MG CAPS capsule Take 1 capsule (0.4 mg total) by mouth daily. 05/01/18   Loren Racer, MD    Family History History reviewed. No pertinent family history.  Social History Social History   Tobacco Use  . Smoking status: Never Smoker  . Smokeless tobacco: Never Used  Substance Use Topics  . Alcohol use: No  . Drug use: Not on file     Allergies   Patient has no known allergies.   Review of Systems Review of Systems  Constitutional: Negative for chills and fever.  HENT: Negative for congestion, rhinorrhea and sore throat.   Eyes:  Negative for visual disturbance.  Respiratory: Negative for cough and shortness of breath.   Cardiovascular: Negative for chest pain and leg swelling.  Gastrointestinal: Negative for abdominal pain, diarrhea, nausea and vomiting.  Genitourinary: Negative for dysuria.  Musculoskeletal: Positive for arthralgias. Negative for back pain, joint swelling and neck pain.  Skin: Negative for rash.  Neurological: Positive for numbness. Negative for dizziness, light-headedness and headaches.  Hematological: Does not bruise/bleed easily.  Psychiatric/Behavioral: Negative for confusion.     Physical Exam Updated Vital  Signs BP (!) 145/83 (BP Location: Right Arm)   Pulse 68   Temp 98.3 F (36.8 C) (Oral)   Resp 19   Ht 1.829 m (6')   Wt 111.1 kg   SpO2 97%   BMI 33.23 kg/m   Physical Exam Vitals signs and nursing note reviewed.  Constitutional:      Appearance: Normal appearance. He is well-developed.  HENT:     Head: Normocephalic and atraumatic.  Eyes:     Extraocular Movements: Extraocular movements intact.     Conjunctiva/sclera: Conjunctivae normal.     Pupils: Pupils are equal, round, and reactive to light.  Neck:     Musculoskeletal: Normal range of motion and neck supple.  Cardiovascular:     Rate and Rhythm: Normal rate and regular rhythm.     Heart sounds: No murmur.  Pulmonary:     Effort: Pulmonary effort is normal. No respiratory distress.     Breath sounds: Normal breath sounds.  Abdominal:     Palpations: Abdomen is soft.     Tenderness: There is no abdominal tenderness.  Musculoskeletal: Normal range of motion.        General: No swelling, tenderness or deformity.  Skin:    General: Skin is warm and dry.     Capillary Refill: Capillary refill takes less than 2 seconds.  Neurological:     General: No focal deficit present.     Mental Status: He is alert and oriented to person, place, and time.     Cranial Nerves: No cranial nerve deficit.     Sensory: No sensory deficit.     Motor: No weakness.      ED Treatments / Results  Labs (all labs ordered are listed, but only abnormal results are displayed) Labs Reviewed - No data to display  EKG None  Radiology No results found.  Procedures Procedures (including critical care time)  Medications Ordered in ED Medications  HYDROmorphone (DILAUDID) injection 2 mg (2 mg Intramuscular Given 10/22/19 0740)  predniSONE (DELTASONE) tablet 60 mg (60 mg Oral Given 10/22/19 0739)     Initial Impression / Assessment and Plan / ED Course  I have reviewed the triage vital signs and the nursing notes.  Pertinent labs  & imaging results that were available during my care of the patient were reviewed by me and considered in my medical decision making (see chart for details).        Patient symptoms consistent with a polyarthralgia but the exact etiology not known.  Follow-up with rheumatology is important.  We will try a course of prednisone again and some hydrocodone for pain relief.  Recommend follow back up with primary care doctor in the meantime.  Patient nontoxic no acute distress.  No redness or increased swelling to the joints.  Involves multiple joints.  But usually just to flareup at a time.  Able to shoulders wrist hands and sometimes knees.  Sometimes hips.  Final Clinical Impressions(s) / ED Diagnoses  Final diagnoses:  Polyarthralgia    ED Discharge Orders         Ordered    HYDROcodone-acetaminophen (NORCO/VICODIN) 5-325 MG tablet  Every 6 hours PRN     10/22/19 0827    predniSONE (DELTASONE) 10 MG tablet  Daily     10/22/19 0827           Fredia Sorrow, MD 10/22/19 [phone]

## 2019-10-22 NOTE — ED Triage Notes (Signed)
Patient arrived via POV c/o generalized back/muscle pain x 3 week, getting progressively worse. Patient endorses pain in all joints, especially hands, shoulders and legs. Patient is AO x 4, impaired gait, VS WDL.

## 2021-07-23 ENCOUNTER — Emergency Department (HOSPITAL_BASED_OUTPATIENT_CLINIC_OR_DEPARTMENT_OTHER): Payer: BC Managed Care – PPO

## 2021-07-23 ENCOUNTER — Emergency Department (HOSPITAL_BASED_OUTPATIENT_CLINIC_OR_DEPARTMENT_OTHER)
Admission: EM | Admit: 2021-07-23 | Discharge: 2021-07-23 | Disposition: A | Discharge status: Home / Self Care | Payer: BC Managed Care – PPO | Attending: Emergency Medicine | Admitting: Emergency Medicine

## 2021-07-23 ENCOUNTER — Encounter (HOSPITAL_BASED_OUTPATIENT_CLINIC_OR_DEPARTMENT_OTHER): Payer: Self-pay

## 2021-07-23 ENCOUNTER — Other Ambulatory Visit: Payer: Self-pay

## 2021-07-23 DIAGNOSIS — W010XXA Fall on same level from slipping, tripping and stumbling without subsequent striking against object, initial encounter: Secondary | ICD-10-CM | POA: Insufficient documentation

## 2021-07-23 DIAGNOSIS — S76112A Strain of left quadriceps muscle, fascia and tendon, initial encounter: Secondary | ICD-10-CM

## 2021-07-23 DIAGNOSIS — Z7984 Long term (current) use of oral hypoglycemic drugs: Secondary | ICD-10-CM | POA: Diagnosis not present

## 2021-07-23 DIAGNOSIS — I10 Essential (primary) hypertension: Secondary | ICD-10-CM | POA: Insufficient documentation

## 2021-07-23 DIAGNOSIS — I2510 Atherosclerotic heart disease of native coronary artery without angina pectoris: Secondary | ICD-10-CM | POA: Insufficient documentation

## 2021-07-23 DIAGNOSIS — E119 Type 2 diabetes mellitus without complications: Secondary | ICD-10-CM | POA: Insufficient documentation

## 2021-07-23 DIAGNOSIS — Y92016 Swimming-pool in single-family (private) house or garden as the place of occurrence of the external cause: Secondary | ICD-10-CM | POA: Insufficient documentation

## 2021-07-23 DIAGNOSIS — S99912A Unspecified injury of left ankle, initial encounter: Secondary | ICD-10-CM | POA: Diagnosis present

## 2021-07-23 DIAGNOSIS — Z7982 Long term (current) use of aspirin: Secondary | ICD-10-CM | POA: Insufficient documentation

## 2021-07-23 DIAGNOSIS — Z79899 Other long term (current) drug therapy: Secondary | ICD-10-CM | POA: Insufficient documentation

## 2021-07-23 DIAGNOSIS — Y93E5 Activity, floor mopping and cleaning: Secondary | ICD-10-CM | POA: Insufficient documentation

## 2021-07-23 DIAGNOSIS — M7989 Other specified soft tissue disorders: Secondary | ICD-10-CM | POA: Insufficient documentation

## 2021-07-23 DIAGNOSIS — S82832A Other fracture of upper and lower end of left fibula, initial encounter for closed fracture: Secondary | ICD-10-CM | POA: Diagnosis not present

## 2021-07-23 MED ORDER — MORPHINE SULFATE (PF) 4 MG/ML IV SOLN
8.0000 mg | Freq: Once | INTRAVENOUS | Status: AC
  Administered 2021-07-23: 8 mg via INTRAMUSCULAR
  Filled 2021-07-23: qty 2

## 2021-07-23 MED ORDER — HYDROCODONE-ACETAMINOPHEN 5-325 MG PO TABS: 1.0000 | ORAL_TABLET | Freq: Four times a day (QID) | ORAL | 0 refills | Status: AC | PRN

## 2021-07-23 MED ORDER — ACETAMINOPHEN 325 MG PO TABS
650.0000 mg | ORAL_TABLET | Freq: Once | ORAL | Status: AC
  Administered 2021-07-23: 650 mg via ORAL
  Filled 2021-07-23: qty 2

## 2021-07-23 MED ORDER — ONDANSETRON 4 MG PO TBDP
4.0000 mg | ORAL_TABLET | Freq: Once | ORAL | Status: AC
  Administered 2021-07-23: 4 mg via ORAL
  Filled 2021-07-23: qty 1

## 2021-07-23 MED ORDER — MORPHINE SULFATE (PF) 10 MG/ML IV SOLN: 10.0000 mg | Freq: Once | INTRAVENOUS | Status: DC

## 2021-07-23 NOTE — Discharge Instructions (Addendum)
Contact a health care provider if:  You have pain or swelling that gets worse or does not get better with rest or medicine.  Your cast gets damaged.  Get help right away if:  You have severe pain that lasts.  You develop new pain or swelling.  Your skin or toenails below the injury turn blue or gray, feel cold, become numb, or are less sensitive to the touch.

## 2021-07-23 NOTE — ED Triage Notes (Signed)
"  Working on pool and slipped down on left knee and now it is swollen and extremely painful, happened just prior to arrival" per pt

## 2021-07-23 NOTE — ED Provider Notes (Signed)
[facility] EMERGENCY DEPARTMENT Provider Note   CSN: [id number] Arrival date & time: 07/23/21  1735     History Chief Complaint  Patient presents with   Knee Pain    Adrian Carlson is a 65 y.o. male presents emergency department with left knee and ankle injury.  Patient was cleaning his pool when he slipped, turned his ankle and fell directly onto his knee.  He has had immediate pain and inability to ambulate or extend the leg since that time.  He also has pain and swelling in the left ankle.  He denies hitting his head or losing consciousness.  He has never had any previous injuries to this area.   Knee Pain Location:  Knee Time since incident:  3 hours     Past Medical History:  Diagnosis Date   Chronic back pain greater than 3 months duration    Coronary artery disease    Diabetes mellitus without complication (HCC)    High cholesterol    Hypertension    Kidney stone    MI (myocardial infarction) (HCC)    twice, 2015   Thyroid disease     There are no problems to display for this patient.   Past Surgical History:  Procedure Laterality Date   BACK SURGERY     CHOLECYSTECTOMY     CORONARY ANGIOPLASTY WITH STENT PLACEMENT         No family history on file.  Social History   Tobacco Use   Smoking status: Never   Smokeless tobacco: Never  Substance Use Topics   Alcohol use: No   Drug use: Never    Home Medications Prior to Admission medications   Medication Sig Start Date End Date Taking? Authorizing Provider  aspirin 81 MG chewable tablet Chew by mouth. 08/22/15   [provider]  atorvastatin (LIPITOR) 40 MG tablet Take 40 mg by mouth daily. 06/28/19   [provider]  HYDROcodone-acetaminophen (NORCO/VICODIN) 5-325 MG tablet Take 1 tablet by mouth every 6 (six) hours as needed for moderate pain. 10/22/19   Vanetta Mulders, MD  levothyroxine (SYNTHROID) 150 MCG tablet Take 150 mcg by mouth daily. 10/08/19   [provider]  lisinopril (ZESTRIL) 5 MG tablet Take 5 mg by mouth daily. 10/16/19   [provider]  metFORMIN (GLUCOPHAGE-XR) 500 MG 24 hr tablet Take 500 mg by mouth 2 (two) times daily. 06/23/19   [provider]  nitroGLYCERIN (NITROSTAT) 0.4 MG SL tablet SMARTSIG:1 Tablet(s) Sublingual As Needed 10/16/19   [provider]  ondansetron (ZOFRAN ODT) 4 MG disintegrating tablet Take 1 tablet (4 mg total) by mouth every 8 (eight) hours as needed. 10/22/19   Vanetta Mulders, MD  predniSONE (DELTASONE) 10 MG tablet Take 4 tablets (40 mg total) by mouth daily. 10/22/19   Vanetta Mulders, MD  sertraline (ZOLOFT) 50 MG tablet Take 50 mg by mouth daily. 08/18/19   [provider]  tamsulosin (FLOMAX) 0.4 MG CAPS capsule Take 1 capsule (0.4 mg total) by mouth daily. 05/01/18   Loren Racer, MD    Allergies    Patient has no known allergies.  Review of Systems   Review of Systems Ten systems reviewed and are negative for acute change, except as noted in the HPI.   Physical Exam Updated Vital Signs BP (!) 128/96   Pulse 77   Temp 98.4 F (36.9 C)   Resp 18   Ht 6' (1.829 m)   Wt 115.7 kg   SpO2  98%   BMI 34.58 kg/m   Physical Exam Physical Exam  Nursing note and vitals reviewed. Constitutional: He appears well-developed and well-nourished. No distress.  HENT:  Head: Normocephalic and atraumatic.  Eyes: Conjunctivae normal are normal. No scleral icterus.  Neck: Normal range of motion. Neck supple.  Cardiovascular: Normal rate, regular rhythm and normal heart sounds.   Pulmonary/Chest: Effort normal and breath sounds normal. No respiratory distress.  Abdominal: Soft. There is no tenderness.  Musculoskeletal: Pain with extension of the leg.  Patient is unable to actively extend the left knee joint.  There is a palpable sulcus above the left patella.  Tenderness over the patella with mild swelling, no bruising. Left ankle with swelling, tenderness  along the distal fibula, limited range of motion, cap refill less than 2 seconds. Neurological: He is alert.  Skin: Skin is warm and dry. He is not diaphoretic.  Psychiatric: His behavior is normal.   ED Results / Procedures / Treatments   Labs (all labs ordered are listed, but only abnormal results are displayed) Labs Reviewed - No data to display  EKG None  Radiology DG Ankle Complete Left  Result Date: 07/23/2021 CLINICAL DATA:  Pain after fall. EXAM: LEFT ANKLE COMPLETE - 3+ VIEW COMPARISON:  None. FINDINGS: There is a fracture through the distal fibula without significant displacement. The distal tibia is intact. The ankle mortise is intact. Lateral soft tissue swelling. IMPRESSION: Distal fibular fracture as above with associated soft tissue swelling. Electronically Signed   By: Gerome Sam III M.D   On: 07/23/2021 20:27   DG Knee Complete 4 Views Left  Result Date: 07/23/2021 CLINICAL DATA:  Pain after fall. EXAM: LEFT KNEE - COMPLETE 4+ VIEW COMPARISON:  None. FINDINGS: Chondrocalcinosis. Minimal degenerative changes in the lateral compartment. Enthesopathic changes off the superior patella. No significant joint effusion. No fractures identified. IMPRESSION: No acute fracture. Enthesopathic changes off the superior patella. Calcification superior to the patella, likely in the quadriceps tendon, chronic. Chondrocalcinosis consistent with CPPD. Electronically Signed   By: Gerome Sam III M.D   On: 07/23/2021 19:07    Procedures Procedures   Medications Ordered in ED Medications  acetaminophen (TYLENOL) tablet 650 mg (650 mg Oral Given 07/23/21 1827)  ondansetron (ZOFRAN-ODT) disintegrating tablet 4 mg (4 mg Oral Given 07/23/21 1951)  morphine 4 MG/ML injection 8 mg (8 mg Intramuscular Given 07/23/21 2006)    ED Course  I have reviewed the triage vital signs and the nursing notes.  Pertinent labs & imaging results that were available during my care of the patient were  reviewed by me and considered in my medical decision making (see chart for details).  Clinical Course as of 07/23/21 2045  Sat Jul 23, 2021  2038 DG Ankle Complete Left [AH]    Clinical Course User Index [AH] Arthor Captain, PA-C   MDM Rules/Calculators/A&P                           Patient here with evidence of quadricep tendon rupture superior to the left patella by my examination.  He also has a small left ankle fracture.  I ordered and reviewed images of the left ankle which show to fracture lines through the distal fibula.  No evidence of syndesmotic rupture or ankle mortise widening.  The radiologist reads calcific tendinosis however I believe in reviewing the images this represents avulsion of the superior aspect of the patella given clinical appearance of tendon rupture.  I discussed the case with Dr. Jena Gauss who asked that we put the patient in a posterior ankle splint, knee immobilizer and make him nonweightbearing.  He may follow-up in his office this coming week and is advised to call the office first thing Monday morning. Final Clinical Impression(s) / ED Diagnoses Final diagnoses:  None    Rx / DC Orders ED Discharge Orders     None        Arthor Captain, PA-C 07/23/21 2100    Rolan Bucco, MD 07/23/21 2252

## 2021-07-23 NOTE — ED Notes (Signed)
Patient transported to X-ray 

## 2022-04-30 IMAGING — CR DG KNEE COMPLETE 4+V*L*
4 series · 4 of 4 positions shown · non-contrast
Comparison: None.

CLINICAL DATA: Pain after fall.

EXAM:
LEFT KNEE - COMPLETE 4+ VIEW

[t knee oblique left (1 of 2)]
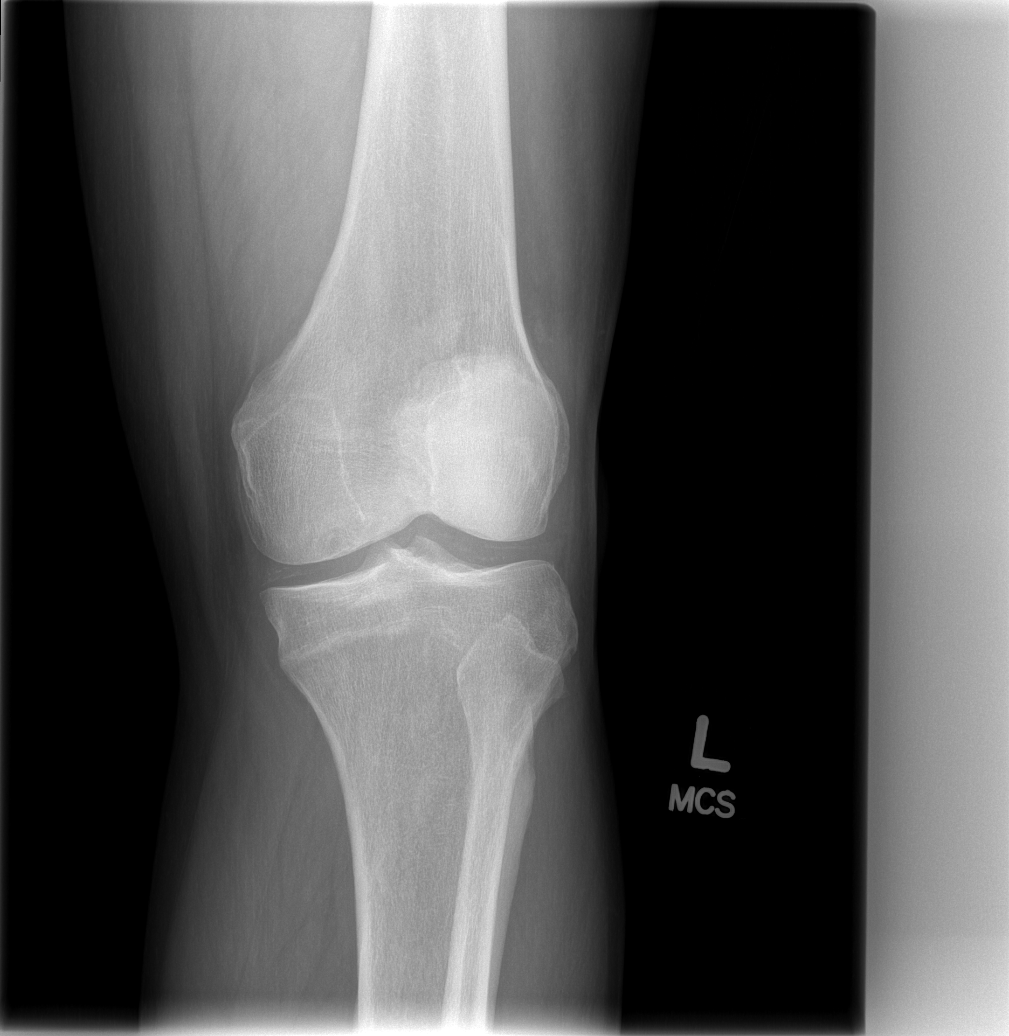

[t knee ap left]
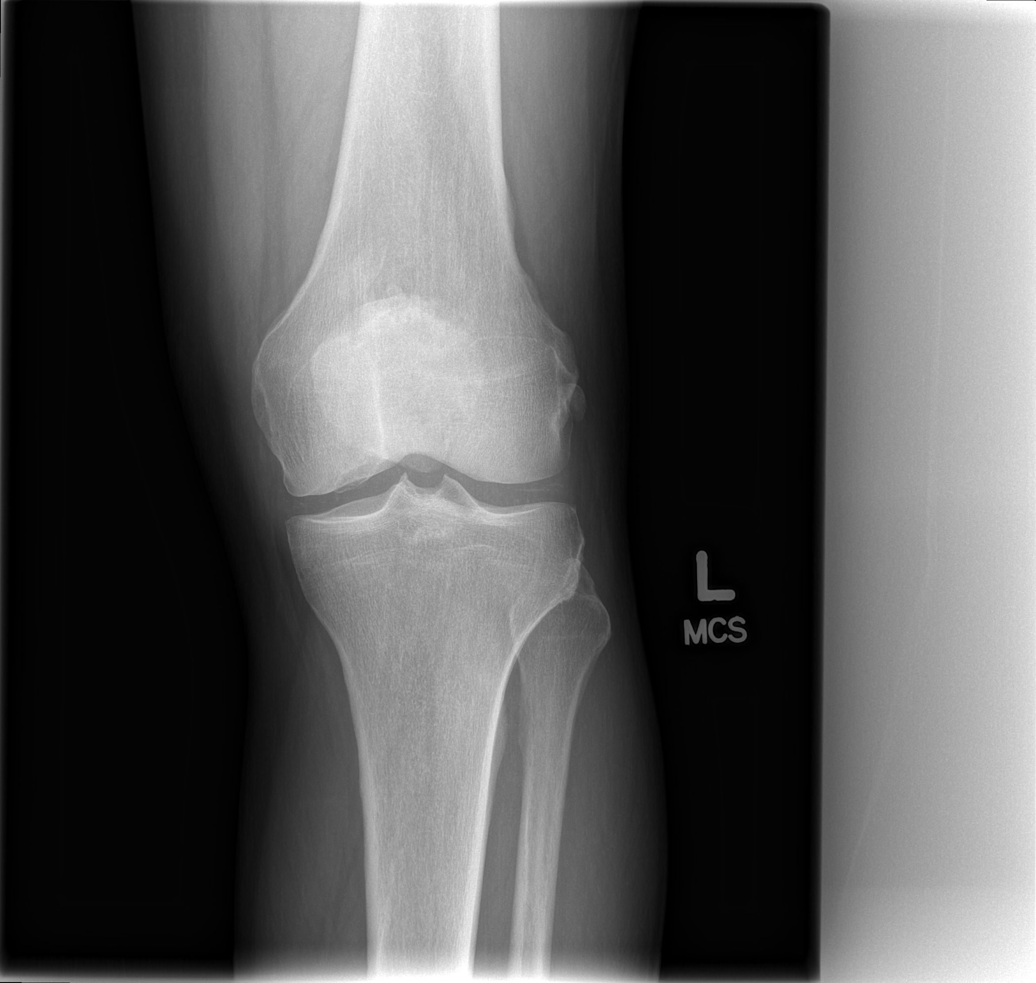

[t knee oblique left (2 of 2)]
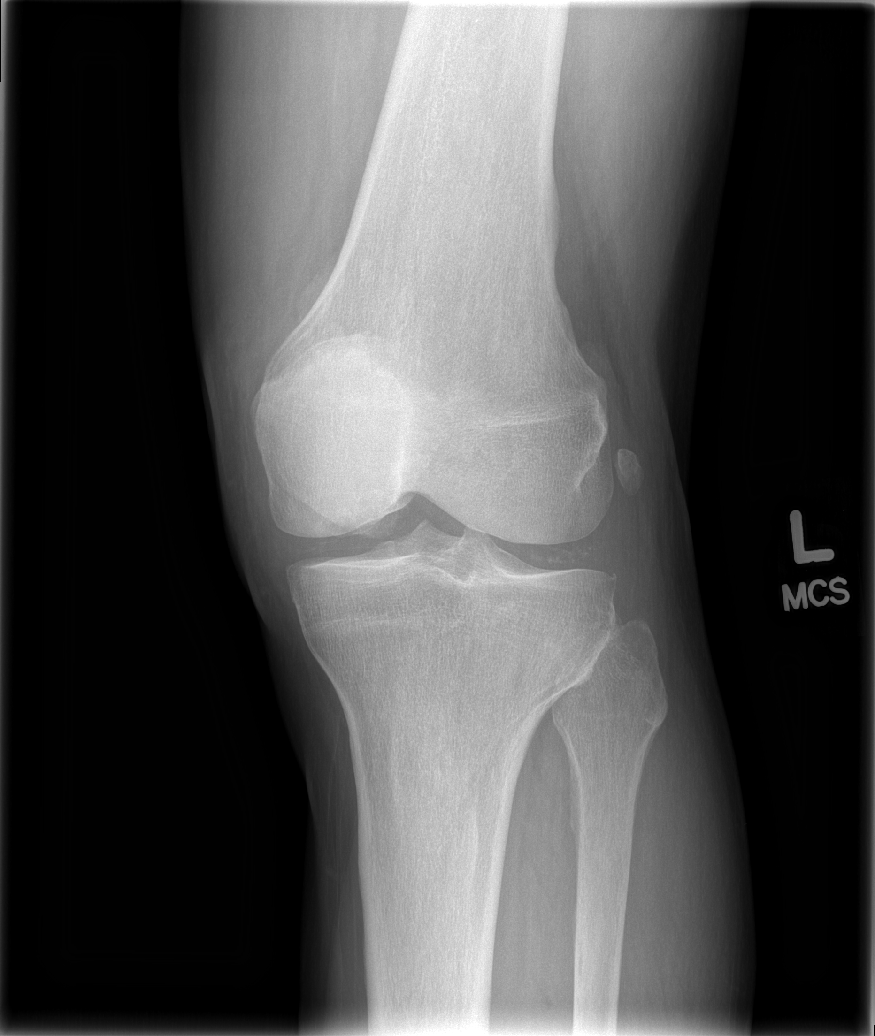

[t knee lat left]
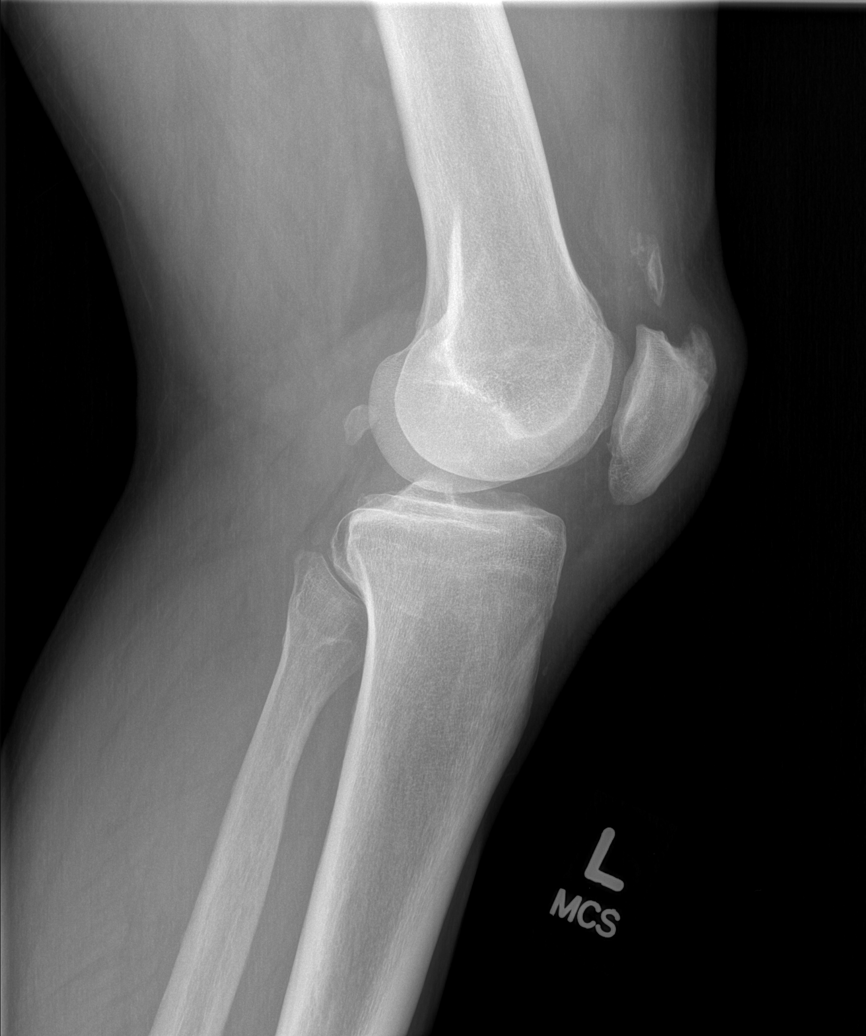

[4 of 4 positions shown; findings below may reference images not displayed]

FINDINGS: Chondrocalcinosis. Minimal degenerative changes in the lateral
compartment. Enthesopathic changes off the superior patella. No
significant joint effusion. No fractures identified.
IMPRESSION: No acute fracture.

Enthesopathic changes off the superior patella. Calcification
superior to the patella, likely in the quadriceps tendon, chronic.

Chondrocalcinosis consistent with CPPD.
